# Patient Record
Sex: Male | Born: 1950 | Race: White | Hispanic: No | Marital: Married | Smoking: Never smoker
Health system: Southern US, Community
[De-identification: ages and names within clinical notes are randomized; demographics above are authoritative.]

## PROBLEM LIST (undated history)

## (undated) DIAGNOSIS — E785 Hyperlipidemia, unspecified: Secondary | ICD-10-CM

## (undated) DIAGNOSIS — M109 Gout, unspecified: Secondary | ICD-10-CM

## (undated) DIAGNOSIS — I10 Essential (primary) hypertension: Secondary | ICD-10-CM

## (undated) DIAGNOSIS — F419 Anxiety disorder, unspecified: Secondary | ICD-10-CM

## (undated) DIAGNOSIS — N529 Male erectile dysfunction, unspecified: Secondary | ICD-10-CM

## (undated) HISTORY — DX: Male erectile dysfunction, unspecified: N52.9

## (undated) HISTORY — PX: TONSILLECTOMY AND ADENOIDECTOMY: SUR1326

## (undated) HISTORY — DX: Hyperlipidemia, unspecified: E78.5

## (undated) HISTORY — DX: Essential (primary) hypertension: I10

## (undated) HISTORY — DX: Anxiety disorder, unspecified: F41.9

## (undated) HISTORY — PX: APPENDECTOMY: SHX54

## (undated) HISTORY — DX: Gout, unspecified: M10.9

## (undated) HISTORY — PX: HERNIA REPAIR: SHX51

---

## 2010-09-22 ENCOUNTER — Ambulatory Visit (HOSPITAL_COMMUNITY)
Admission: RE | Admit: 2010-09-22 | Discharge: 2010-09-22 | Disposition: A | Discharge status: Home / Self Care | Payer: 59 | Source: Ambulatory Visit | Attending: Internal Medicine | Admitting: Internal Medicine

## 2010-09-22 ENCOUNTER — Other Ambulatory Visit (HOSPITAL_COMMUNITY): Payer: Self-pay | Admitting: Internal Medicine

## 2010-09-22 DIAGNOSIS — R52 Pain, unspecified: Secondary | ICD-10-CM | POA: Insufficient documentation

## 2010-09-22 DIAGNOSIS — M47817 Spondylosis without myelopathy or radiculopathy, lumbosacral region: Secondary | ICD-10-CM | POA: Insufficient documentation

## 2010-09-22 DIAGNOSIS — M539 Dorsopathy, unspecified: Secondary | ICD-10-CM | POA: Insufficient documentation

## 2012-02-15 IMAGING — CR DG LUMBAR SPINE COMPLETE 4+V
5 series · 5 of 5 positions shown · non-contrast
Comparison: None.

CLINICAL DATA: Back pain.  780.96.

LUMBAR SPINE - COMPLETE 4+ VIEW

[view not recorded (1 of 5)]
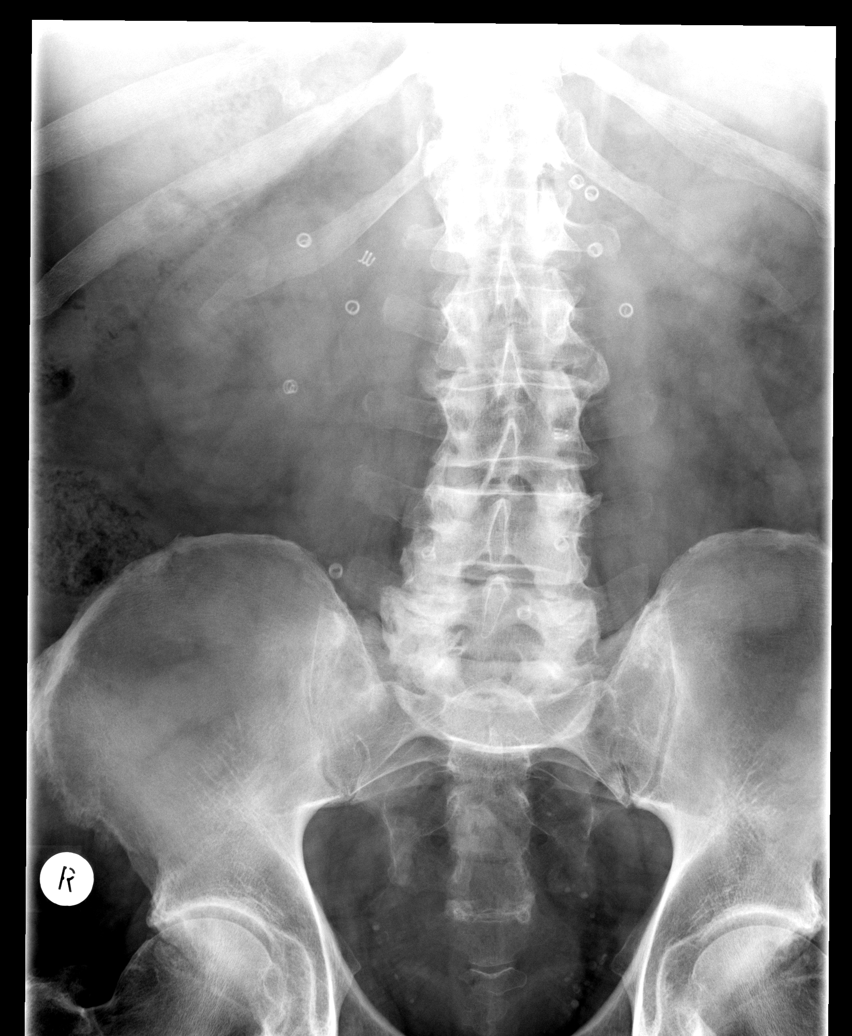

[view not recorded (2 of 5)]
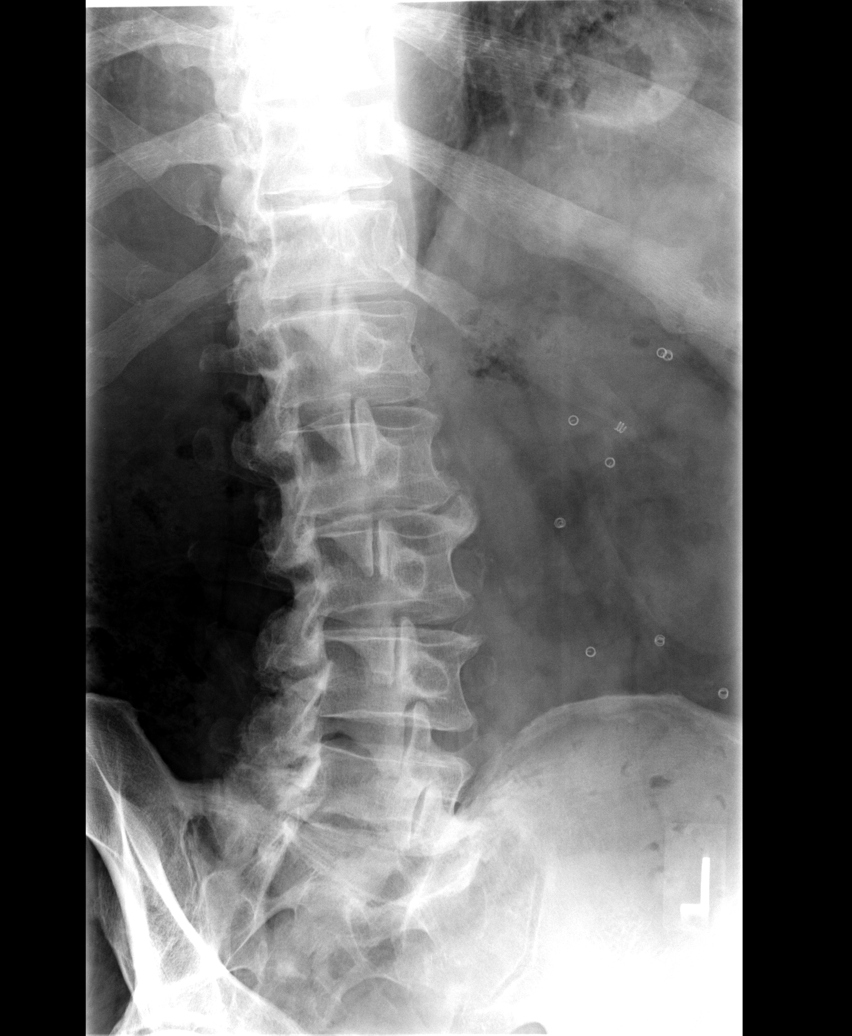

[view not recorded (3 of 5)]
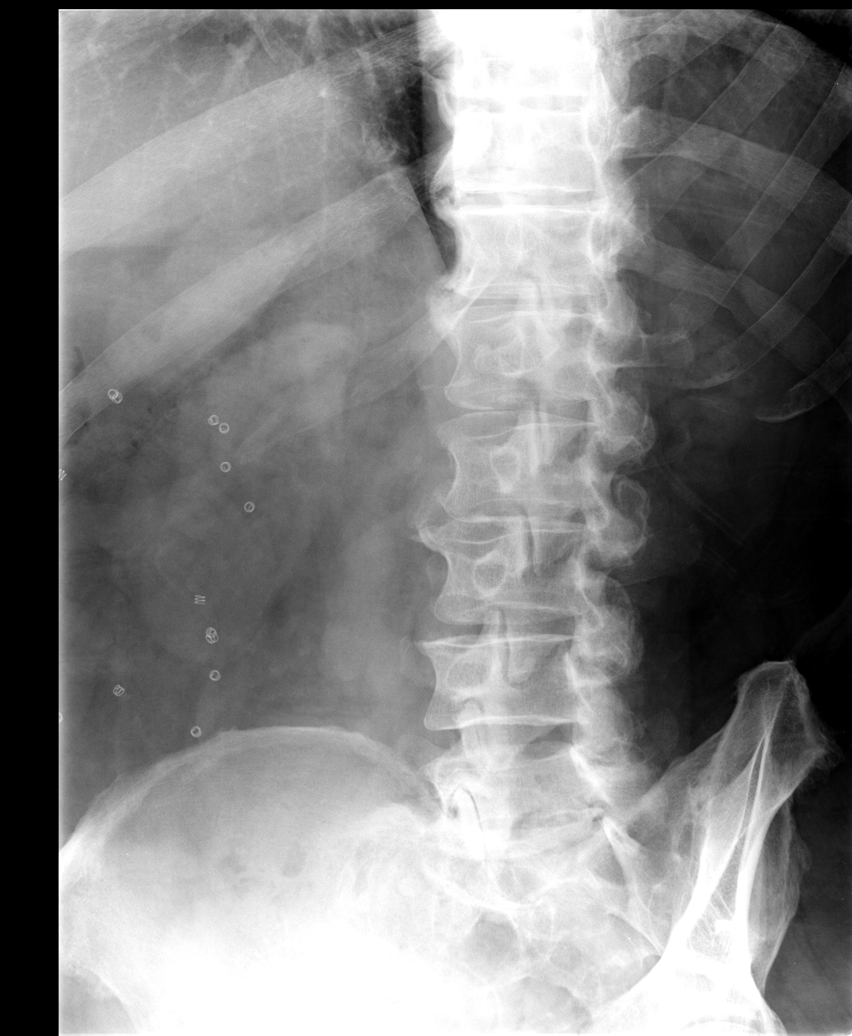

[view not recorded (4 of 5)]
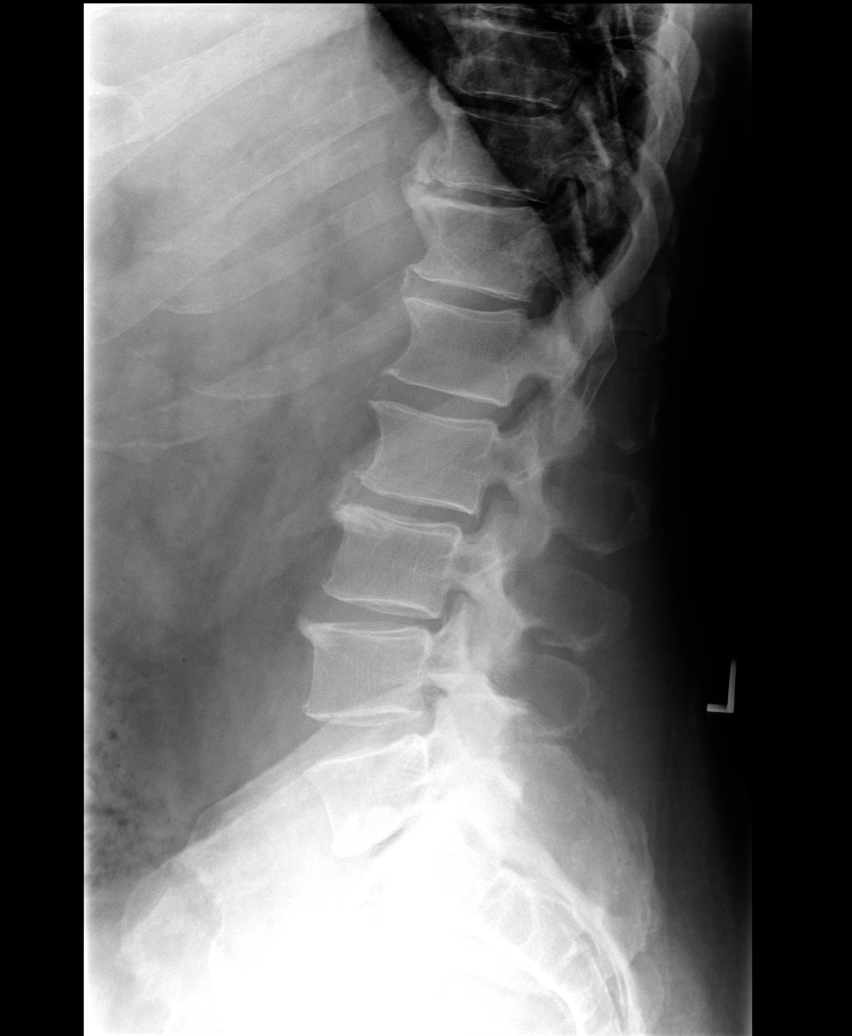

[view not recorded (5 of 5)]
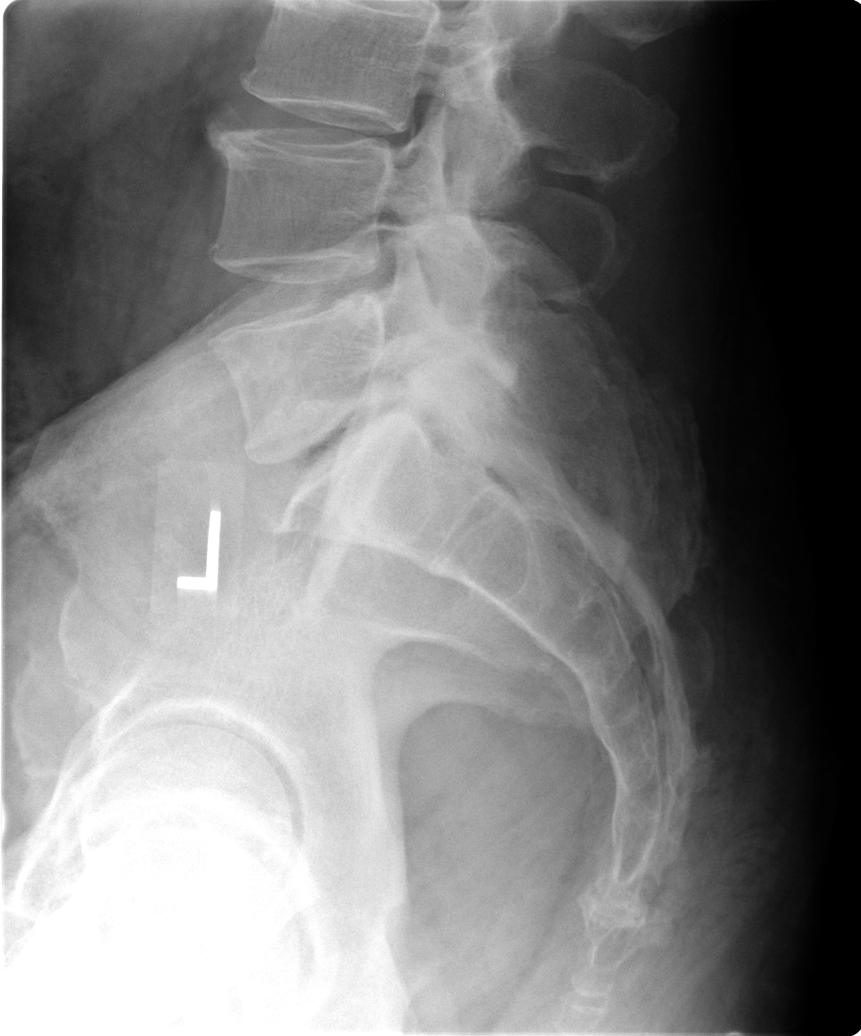

[5 of 5 positions shown; findings below may reference images not displayed]

FINDINGS: There is no fracture.  There is mild disc space narrowing
at L2-3 and moderate disc space narrowing at L5-S1.  There is also
moderate facet arthritis throughout the lumbar spine, most severe
at L4-5 and L5 S1 bilaterally, right greater than left.
IMPRESSION: Degenerative disc and joint disease in the lower lumbar spine.

## 2012-08-02 ENCOUNTER — Other Ambulatory Visit: Payer: Self-pay | Admitting: Family Medicine

## 2012-12-18 ENCOUNTER — Encounter (INDEPENDENT_AMBULATORY_CARE_PROVIDER_SITE_OTHER): Payer: Self-pay

## 2012-12-20 ENCOUNTER — Encounter (INDEPENDENT_AMBULATORY_CARE_PROVIDER_SITE_OTHER): Payer: Self-pay | Admitting: Surgery

## 2012-12-20 ENCOUNTER — Ambulatory Visit (INDEPENDENT_AMBULATORY_CARE_PROVIDER_SITE_OTHER): Payer: Commercial Managed Care - PPO | Admitting: Surgery

## 2012-12-20 VITALS — BP 122/82 | HR 64 | Temp 98.6°F | Resp 16 | Ht 61.0 in | Wt 254.0 lb

## 2012-12-20 DIAGNOSIS — R2231 Localized swelling, mass and lump, right upper limb: Secondary | ICD-10-CM

## 2012-12-20 DIAGNOSIS — R229 Localized swelling, mass and lump, unspecified: Secondary | ICD-10-CM

## 2012-12-20 NOTE — Progress Notes (Signed)
Patient ID: Phillip Lutz, male   DOB: 11-Mar-1951, 62 y.o.   MRN: [id number]  Chief Complaint  Patient presents with  . New Evaluation    eval rt shoulder cyst    HPI Phillip Lutz is a 62 y.o. male.  Patient sent at the request of Quentin Mulling PA-C for mass overlying the patient's right clavicle at the acromioclavicular joint. It is not causing any pain, redness or drainage. It has been present for 10 years. It was noted on physical exam. Denies any shoulder pain. HPI  Past Medical History  Diagnosis Date  . Hypertension   . Hyperlipidemia   . Anxiety   . Gout     Past Surgical History  Procedure Laterality Date  . Hernia repair    . Tonsillectomy and adenoidectomy    . Appendectomy      No family history on file.  Social History History  Substance Use Topics  . Smoking status: Never Smoker   . Smokeless tobacco: Not on file  . Alcohol Use: No    No Known Allergies  Current Outpatient Prescriptions  Medication Sig Dispense Refill  . allopurinol (ZYLOPRIM) 300 MG tablet Take 300 mg by mouth daily.      Marland Kitchen ALPRAZolam (XANAX) 0.5 MG tablet Take 0.5 mg by mouth at bedtime as needed for sleep.      . hydrochlorothiazide (HYDRODIURIL) 25 MG tablet Take 25 mg by mouth daily.      Marland Kitchen olmesartan (BENICAR) 40 MG tablet Take 40 mg by mouth daily.      . Sildenafil Citrate (VIAGRA PO) Take by mouth.       No current facility-administered medications for this visit.    Review of Systems Review of Systems  Constitutional: Negative for fever, chills and unexpected weight change.  HENT: Negative for hearing loss, congestion, sore throat, trouble swallowing and voice change.   Eyes: Negative for visual disturbance.  Respiratory: Negative for cough and wheezing.   Cardiovascular: Negative for chest pain, palpitations and leg swelling.  Gastrointestinal: Negative for nausea, vomiting, abdominal pain, diarrhea, constipation, blood in stool, abdominal distention, anal  bleeding and rectal pain.  Genitourinary: Negative for hematuria and difficulty urinating.  Musculoskeletal: Negative for arthralgias.  Skin: Negative for rash and wound.  Neurological: Negative for seizures, syncope, weakness and headaches.  Hematological: Negative for adenopathy. Does not bruise/bleed easily.  Psychiatric/Behavioral: Negative for confusion.    Blood pressure 122/82, pulse 64, temperature 98.6 F (37 C), temperature source Oral, resp. rate 16, height 5\' 1"  (1.549 m), weight 254 lb (115.214 kg).  Physical Exam Physical Exam  Constitutional: He is oriented to person, place, and time. He appears well-developed and well-nourished.  HENT:  Head: Normocephalic and atraumatic.  Eyes: EOM are normal. Pupils are equal, round, and reactive to light.  Neck: Normal range of motion. Neck supple.  Musculoskeletal: Normal range of motion.  Neurological: He is alert and oriented to person, place, and time.  Skin:     Psychiatric: He has a normal mood and affect. His behavior is normal. Thought content normal.    Data Reviewed Notes from Dr Oneta Rack office  Assessment    Right shoulder mass over her a.c. Joint fixed 3 cm    Plan    CT scan of right shoulder to further evaluate relationship of mass to the acromium, clavicle and underlying structures since it is immobile. Operative planning once this is done. If benign can be observed since patient asymptomatic. If involving joint space, refer to  orthopedics.       Noor Vidales A. 12/20/2012, 2:44 PM

## 2012-12-20 NOTE — Patient Instructions (Signed)
Will schedule for CT scan of right shoulder.

## 2012-12-21 ENCOUNTER — Telehealth (INDEPENDENT_AMBULATORY_CARE_PROVIDER_SITE_OTHER): Payer: Self-pay | Admitting: General Surgery

## 2012-12-21 NOTE — Telephone Encounter (Signed)
Spoke with pt and informed him of his Ct scan at [facility] located at [address] on 12/27/12 at 9:00.  Explained that he cannot have solid foods 4 hours before the test.  He informed me that he is fine with this day but he was personally going to call GI ans see if he could reschedule it for a later time that day.  The number was provided for him.

## 2012-12-26 NOTE — Telephone Encounter (Signed)
Spoke with pt's wife and explained that he has been switched to [facility] radiology for his Ct scan.  It is on 12/27/12 at 8:15. All other instructions will stay the same.

## 2012-12-27 ENCOUNTER — Other Ambulatory Visit: Payer: Commercial Managed Care - PPO

## 2012-12-27 ENCOUNTER — Ambulatory Visit (HOSPITAL_COMMUNITY): Payer: 59

## 2012-12-31 ENCOUNTER — Ambulatory Visit (HOSPITAL_COMMUNITY): Payer: 59

## 2013-01-01 ENCOUNTER — Telehealth (INDEPENDENT_AMBULATORY_CARE_PROVIDER_SITE_OTHER): Payer: Self-pay

## 2013-01-01 ENCOUNTER — Other Ambulatory Visit (INDEPENDENT_AMBULATORY_CARE_PROVIDER_SITE_OTHER): Payer: Self-pay

## 2013-01-01 DIAGNOSIS — R2230 Localized swelling, mass and lump, unspecified upper limb: Secondary | ICD-10-CM

## 2013-01-01 NOTE — Telephone Encounter (Signed)
Message copied by Brennan Bailey on Tue Jan 01, 2013 10:58 AM ------      Message from: Dortha Schwalbe      Created: Tue Jan 01, 2013 10:44 AM      Regarding: RE: CT       without      ----- Message -----         From: Brennan Bailey, CMA         Sent: 01/01/2013   9:54 AM           To: Thomas A. Cornett, MD      Subject: FW: CT                                                               ----- Message -----         From: Rolene Course         Sent: 01/01/2013   9:16 AM           To: Brennan Bailey, CMA      Subject: CT                                                       Rebecka Apley  calling from [facility].  She needs you to verify that pt's CT is either with or without contrast.  If without, she will need you to redo the orders.  Pt going tomorrow for Ct.  She can be reached at [phone]            Thanks!      Meagen       ------

## 2013-01-01 NOTE — Telephone Encounter (Signed)
Called Rebecka Apley and let her know I put a new order in for Ct right shoulder without contrast.

## 2013-01-02 ENCOUNTER — Ambulatory Visit (HOSPITAL_COMMUNITY)
Admission: RE | Admit: 2013-01-02 | Discharge: 2013-01-02 | Disposition: A | Discharge status: Home / Self Care | Payer: 59 | Source: Ambulatory Visit | Attending: Surgery | Admitting: Surgery

## 2013-03-04 ENCOUNTER — Telehealth: Payer: Self-pay | Admitting: *Deleted

## 2013-03-04 MED ORDER — ALPRAZOLAM 0.5 MG PO TABS: 0.5000 mg | ORAL_TABLET | Freq: Every evening | ORAL | Status: DC | PRN

## 2013-03-04 NOTE — Telephone Encounter (Signed)
REFILL =  [facility] 0.5MG  #30      [facility]  [address]  [place] [address] [phone] [facility]  [phone] FAX

## 2013-03-05 ENCOUNTER — Other Ambulatory Visit: Payer: Self-pay | Admitting: Physician Assistant

## 2013-03-05 MED ORDER — ALPRAZOLAM 0.5 MG PO TABS: 0.5000 mg | ORAL_TABLET | Freq: Every day | ORAL | Status: DC

## 2013-03-12 ENCOUNTER — Ambulatory Visit: Payer: 59 | Admitting: Physician Assistant

## 2013-05-19 DIAGNOSIS — E785 Hyperlipidemia, unspecified: Secondary | ICD-10-CM | POA: Insufficient documentation

## 2013-05-19 DIAGNOSIS — I10 Essential (primary) hypertension: Secondary | ICD-10-CM | POA: Insufficient documentation

## 2013-05-19 DIAGNOSIS — M109 Gout, unspecified: Secondary | ICD-10-CM | POA: Insufficient documentation

## 2013-05-19 DIAGNOSIS — N529 Male erectile dysfunction, unspecified: Secondary | ICD-10-CM | POA: Insufficient documentation

## 2013-05-19 DIAGNOSIS — F419 Anxiety disorder, unspecified: Secondary | ICD-10-CM | POA: Insufficient documentation

## 2013-05-20 ENCOUNTER — Ambulatory Visit: Payer: Self-pay | Admitting: Physician Assistant

## 2013-05-29 ENCOUNTER — Other Ambulatory Visit: Payer: Self-pay | Admitting: Physician Assistant

## 2013-05-29 MED ORDER — OLMESARTAN MEDOXOMIL 40 MG PO TABS: 40.0000 mg | ORAL_TABLET | Freq: Every day | ORAL | Status: DC

## 2013-06-05 ENCOUNTER — Ambulatory Visit: Payer: Self-pay | Admitting: Physician Assistant

## 2013-06-06 ENCOUNTER — Other Ambulatory Visit: Payer: Self-pay | Admitting: Physician Assistant

## 2013-06-11 ENCOUNTER — Ambulatory Visit: Payer: Self-pay | Admitting: Physician Assistant

## 2013-06-12 ENCOUNTER — Ambulatory Visit: Payer: Self-pay | Admitting: Internal Medicine

## 2013-06-19 ENCOUNTER — Ambulatory Visit (INDEPENDENT_AMBULATORY_CARE_PROVIDER_SITE_OTHER): Payer: 59 | Admitting: Physician Assistant

## 2013-06-19 ENCOUNTER — Encounter: Payer: Self-pay | Admitting: Physician Assistant

## 2013-06-19 VITALS — BP 152/82 | HR 80 | Temp 97.9°F | Resp 16 | Ht 72.0 in | Wt 261.0 lb

## 2013-06-19 DIAGNOSIS — F419 Anxiety disorder, unspecified: Secondary | ICD-10-CM

## 2013-06-19 DIAGNOSIS — F411 Generalized anxiety disorder: Secondary | ICD-10-CM

## 2013-06-19 DIAGNOSIS — Z79899 Other long term (current) drug therapy: Secondary | ICD-10-CM

## 2013-06-19 DIAGNOSIS — E785 Hyperlipidemia, unspecified: Secondary | ICD-10-CM

## 2013-06-19 DIAGNOSIS — I10 Essential (primary) hypertension: Secondary | ICD-10-CM

## 2013-06-19 DIAGNOSIS — E559 Vitamin D deficiency, unspecified: Secondary | ICD-10-CM

## 2013-06-19 LAB — CBC WITH DIFFERENTIAL/PLATELET
BASOS PCT: 1 % (ref 0–1)
Basophils Absolute: 0.1 10*3/uL (ref 0.0–0.1)
EOS ABS: 0.2 10*3/uL (ref 0.0–0.7)
EOS PCT: 3 % (ref 0–5)
HCT: 44.2 % (ref 39.0–52.0)
Hemoglobin: 15.3 g/dL (ref 13.0–17.0)
LYMPHS ABS: 3 10*3/uL (ref 0.7–4.0)
Lymphocytes Relative: 46 % (ref 12–46)
MCH: 31.2 pg (ref 26.0–34.0)
MCHC: 34.6 g/dL (ref 30.0–36.0)
MCV: 90 fL (ref 78.0–100.0)
Monocytes Absolute: 0.7 10*3/uL (ref 0.1–1.0)
Monocytes Relative: 10 % (ref 3–12)
NEUTROS PCT: 40 % — AB (ref 43–77)
Neutro Abs: 2.6 10*3/uL (ref 1.7–7.7)
PLATELETS: 178 10*3/uL (ref 150–400)
RBC: 4.91 MIL/uL (ref 4.22–5.81)
RDW: 14.4 % (ref 11.5–15.5)
WBC: 6.6 10*3/uL (ref 4.0–10.5)

## 2013-06-19 MED ORDER — ACYCLOVIR 400 MG PO TABS: 400.0000 mg | ORAL_TABLET | Freq: Every day | ORAL | Status: AC

## 2013-06-19 MED ORDER — SILDENAFIL CITRATE 100 MG PO TABS: 100.0000 mg | ORAL_TABLET | ORAL | Status: AC | PRN

## 2013-06-19 NOTE — Progress Notes (Signed)
HPI 63 y.o. male  presents for 3 month follow up with hypertension, hyperlipidemia, prediabetes and vitamin D. His blood pressure has been controlled at home, today their BP is BP: 152/82 mmHg He denies chest pain, shortness of breath, dizziness.  His cholesterol is diet controlled.  His cholesterol is not controlled. The cholesterol last visit was: LDL 155. Declines statins  Patient is on Vitamin D supplement. , B12 was 303 Has been taking his wives acyclovir 400mg  QD and he has not had any outbreaks.  History of calcium 11.2.  Current Medications:  Current Outpatient Prescriptions on File Prior to Visit  Medication Sig Dispense Refill  . allopurinol (ZYLOPRIM) 300 MG tablet Take 300 mg by mouth daily.      Marland Kitchen. ALPRAZolam (XANAX) 0.5 MG tablet Take 1 tablet (0.5 mg total) by mouth at bedtime.  30 tablet  4  . meloxicam (MOBIC) 15 MG tablet TAKE 1 TABLET BY MOUTH ONCE DAILY  30 tablet  0  . olmesartan (BENICAR) 40 MG tablet Take 1 tablet (40 mg total) by mouth daily.  30 tablet  3  . Sildenafil Citrate (VIAGRA PO) Take by mouth.       No current facility-administered medications on file prior to visit.   Medical History:  Past Medical History  Diagnosis Date  . Gout   . Anxiety   . Hyperlipidemia   . Hypertension   . ED (erectile dysfunction)   . Gout    Allergies: No Known Allergies   Review of Systems: [X]  = complains of  [ ]  = denies  General: Fatigue [ ]  Fever [ ]  Chills [ ]  Weakness [ ]   Insomnia [ ]  Eyes: Redness [ ]  Blurred vision [ ]  Diplopia [ ]   ENT: Congestion [ ]  Sinus Pain [ ]  Post Nasal Drip [ ]  Sore Throat [ ]  Earache [ ]   Cardiac: Chest pain/pressure [ ]  SOB [ ]  Orthopnea [ ]   Palpitations [ ]   Paroxysmal nocturnal dyspnea[ ]  Claudication [ ]  Edema [ ]   Pulmonary: Cough [ ]  Wheezing[ ]   SOB [ ]   Snoring [ ]   GI: Nausea [ ]  Vomiting[ ]  Dysphagia[ ]  Heartburn[ ]  Abdominal pain [ ]  Constipation [ ] ; Diarrhea [ ] ; BRBPR [ ]  Melena[ ]  GU: Hematuria[ ]  Dysuria [ ]   Nocturia[ ]  Urgency [ ]   Hesitancy [ ]  Discharge [ ]  Neuro: Headaches[ ]  Vertigo[ ]  Paresthesias[ ]  Spasm [ ]  Speech changes [ ]  Incoordination [ ]   Ortho: Arthritis [ ]  Joint pain [ ]  Muscle pain [ ]  Joint swelling [ ]  Back Pain [ ]  Skin:  Rash [ ]   Pruritis [ ]  Change in skin lesion [ ]   Psych: Depression[ ]  Anxiety[ ]  Confusion [ ]  Memory loss [ ]   Heme/Lypmh: Bleeding [ ]  Bruising [ ]  Enlarged lymph nodes [ ]   Endocrine: Visual blurring [ ]  Paresthesia [ ]  Polyuria [ ]  Polydypsea [ ]    Heat/cold intolerance [ ]  Hypoglycemia [ ]   Family history- Review and unchanged Social history- Review and unchanged Physical Exam: Filed Vitals:   06/19/13 1511  BP: 152/82  Pulse: 80  Temp: 97.9 F (36.6 C)  Resp: 16   Wt Readings from Last 3 Encounters:  06/19/13 261 lb (118.389 kg)  12/20/12 254 lb (115.214 kg)   General Appearance: Well nourished, in no apparent distress. Eyes: PERRLA, EOMs, conjunctiva no swelling or erythema Sinuses: No Frontal/maxillary tenderness ENT/Mouth: Ext aud canals clear, TMs without erythema, bulging. No erythema, swelling, or exudate  on post pharynx.  Tonsils not swollen or erythematous. Hearing normal.  Neck: Supple, thyroid normal.  Respiratory: Respiratory effort normal, BS equal bilaterally without rales, rhonchi, wheezing or stridor.  Cardio: RRR with no MRGs. Brisk peripheral pulses without edema.  Abdomen: Soft, + BS.  Non tender, no guarding, rebound, hernias, masses. Lymphatics: Non tender without lymphadenopathy.  Musculoskeletal: Full ROM, 5/5 strength, normal gait.  Skin: Warm, dry without rashes, lesions, ecchymosis.  Neuro: Cranial nerves intact. Normal muscle tone, no cerebellar symptoms. Sensation intact.  Psych: Awake and oriented X 3, normal affect, Insight and Judgment appropriate.   Assessment and Plan:  Hypertension: Continue medication, monitor blood pressure at home.  Continue DASH diet. Cholesterol: Continue diet and exercise.  Check cholesterol.  Pre-diabetes-Continue diet and exercise. Check A1C Vitamin D Def- check level and continue medications.  B12 def Hypercalcemia  Continue diet and meds as discussed. Further disposition pending results of labs.  Quentin Mulling 3:47 PM

## 2013-06-19 NOTE — Patient Instructions (Addendum)
1-2 TBSP of BENEFIBER or the similar medication daily with the red yeast rice for cholesterol  DASH Diet The DASH diet stands for "Dietary Approaches to Stop Hypertension." It is a healthy eating plan that has been shown to reduce high blood pressure (hypertension) in as little as 14 days, while also possibly providing other significant health benefits. These other health benefits include reducing the risk of breast cancer after menopause and reducing the risk of type 2 diabetes, heart disease, colon cancer, and stroke. Health benefits also include weight loss and slowing kidney failure in patients with chronic kidney disease.  DIET GUIDELINES  Limit salt (sodium). Your diet should contain less than 1500 mg of sodium daily.  Limit refined or processed carbohydrates. Your diet should include mostly whole grains. Desserts and added sugars should be used sparingly.  Include small amounts of heart-healthy fats. These types of fats include nuts, oils, and tub margarine. Limit saturated and trans fats. These fats have been shown to be harmful in the body. CHOOSING FOODS  The following food groups are based on a 2000 calorie diet. See your Registered Dietitian for individual calorie needs. Grains and Grain Products (6 to 8 servings daily)  Eat More Often: Whole-wheat bread, brown rice, whole-grain or wheat pasta, quinoa, popcorn without added fat or salt (air popped).  Eat Less Often: White bread, white pasta, white rice, cornbread. Vegetables (4 to 5 servings daily)  Eat More Often: Fresh, frozen, and canned vegetables. Vegetables may be raw, steamed, roasted, or grilled with a minimal amount of fat.  Eat Less Often/Avoid: Creamed or fried vegetables. Vegetables in a cheese sauce. Fruit (4 to 5 servings daily)  Eat More Often: All fresh, canned (in natural juice), or frozen fruits. Dried fruits without added sugar. One hundred percent fruit juice ( cup [237 mL] daily).  Eat Less Often: Dried  fruits with added sugar. Canned fruit in light or heavy syrup. [employer], Fish, and Poultry (2 servings or less daily. One serving is 3 to 4 oz [85-114 g]).  Eat More Often: Ninety percent or leaner ground beef, tenderloin, sirloin. Round cuts of beef, chicken breast, [place] breast. All fish. Grill, bake, or broil your meat. Nothing should be fried.  Eat Less Often/Avoid: Fatty cuts of meat, [place], or chicken leg, thigh, or wing. Fried cuts of meat or fish. Dairy (2 to 3 servings)  Eat More Often: Low-fat or fat-free milk, low-fat plain or light yogurt, reduced-fat or part-skim cheese.  Eat Less Often/Avoid: Milk (whole, 2%).Whole milk yogurt. Full-fat cheeses. Nuts, Seeds, and Legumes (4 to 5 servings per week)  Eat More Often: All without added salt.  Eat Less Often/Avoid: Salted nuts and seeds, canned beans with added salt. Fats and Sweets (limited)  Eat More Often: Vegetable oils, tub margarines without trans fats, sugar-free gelatin. Mayonnaise and salad dressings.  Eat Less Often/Avoid: Coconut oils, palm oils, butter, stick margarine, cream, half and half, cookies, candy, pie. FOR MORE INFORMATION The Dash Diet Eating Plan: www.dashdiet.org Document Released: 03/31/2011 Document Revised: 07/04/2011 Document Reviewed: 03/31/2011 [facility] Patient Information 2014 [place], [place]. Cholesterol Cholesterol is a white, waxy, fat-like protein needed by your body in small amounts. The liver makes all the cholesterol you need. It is carried from the liver by the blood through the blood vessels. Deposits (plaque) may build up on blood vessel walls. This makes the arteries narrower and stiffer. Plaque increases the risk for heart attack and stroke. You cannot feel your cholesterol level even if it is  very high. The only way to know is by a blood test to check your lipid (fats) levels. Once you know your cholesterol levels, you should keep a record of the test results. Work with your  caregiver to to keep your levels in the desired range. WHAT THE RESULTS MEAN:  Total cholesterol is a rough measure of all the cholesterol in your blood.  LDL is the so-called bad cholesterol. This is the type that deposits cholesterol in the walls of the arteries. You want this level to be low.  HDL is the good cholesterol because it cleans the arteries and carries the LDL away. You want this level to be high.  Triglycerides are fat that the body can either burn for energy or store. High levels are closely linked to heart disease. DESIRED LEVELS:  Total cholesterol below 200.  LDL below 100 for people at risk, below 70 for very high risk.  HDL above 50 is good, above 60 is best.  Triglycerides below 150. HOW TO LOWER YOUR CHOLESTEROL:  Diet.  Choose fish or white meat chicken and [place]key, roasted or baked. Limit fatty cuts of red meat, fried foods, and processed meats, such as sausage and lunch meat.  Eat lots of fresh fruits and vegetables. Choose whole grains, beans, pasta, potatoes and cereals.  Use only small amounts of olive, corn or canola oils. Avoid butter, mayonnaise, shortening or palm kernel oils. Avoid foods with trans-fats.  Use skim/nonfat milk and low-fat/nonfat yogurt and cheeses. Avoid whole milk, cream, ice cream, egg yolks and cheeses. Healthy desserts include angel food cake, ginger snaps, animal crackers, hard candy, popsicles, and low-fat/nonfat frozen yogurt. Avoid pastries, cakes, pies and cookies.  Exercise.  A regular program helps decrease LDL and raises HDL.  Helps with weight control.  Do things that increase your activity level like gardening, walking, or taking the stairs.  Medication.  May be prescribed by your caregiver to help lowering cholesterol and the risk for heart disease.  You may need medicine even if your levels are normal if you have several risk factors. HOME CARE INSTRUCTIONS   Follow your diet and exercise programs as  suggested by your caregiver.  Take medications as directed.  Have blood work done when your caregiver feels it is necessary. MAKE SURE YOU:   Understand these instructions.  Will watch your condition.  Will get help right away if you are not doing well or get worse. Document Released: 01/04/2001 Document Revised: 07/04/2011 Document Reviewed: 01/23/2013 [facility] Patient Information 2014 [place]

## 2013-06-20 LAB — LIPID PANEL
CHOLESTEROL: 217 mg/dL — AB (ref 0–200)
HDL: 60 mg/dL (ref >39)
LDL Cholesterol: 132 mg/dL — ABNORMAL HIGH (ref 0–99)
Total CHOL/HDL Ratio: 3.6 Ratio
Triglycerides: 127 mg/dL (ref <150)
VLDL: 25 mg/dL (ref 0–40)

## 2013-06-20 LAB — MAGNESIUM: MAGNESIUM: 1.6 mg/dL (ref 1.5–2.5)

## 2013-06-20 LAB — BASIC METABOLIC PANEL WITH GFR
BUN: 15 mg/dL (ref 6–23)
CALCIUM: 11.5 mg/dL — AB (ref 8.4–10.5)
CO2: 29 meq/L (ref 19–32)
CREATININE: 0.93 mg/dL (ref 0.50–1.35)
Chloride: 101 mEq/L (ref 96–112)
GFR, Est Non African American: 88 mL/min
GLUCOSE: 65 mg/dL — AB (ref 70–99)
Potassium: 4.3 mEq/L (ref 3.5–5.3)
SODIUM: 140 meq/L (ref 135–145)

## 2013-06-20 LAB — HEPATIC FUNCTION PANEL
ALBUMIN: 4.6 g/dL (ref 3.5–5.2)
ALT: 57 U/L — AB (ref 0–53)
AST: 38 U/L — AB (ref 0–37)
Alkaline Phosphatase: 57 U/L (ref 39–117)
BILIRUBIN DIRECT: 0.1 mg/dL (ref 0.0–0.3)
BILIRUBIN TOTAL: 0.5 mg/dL (ref 0.2–1.2)
Indirect Bilirubin: 0.4 mg/dL (ref 0.2–1.2)
Total Protein: 6.9 g/dL (ref 6.0–8.3)

## 2013-06-20 LAB — VITAMIN D 25 HYDROXY (VIT D DEFICIENCY, FRACTURES): VIT D 25 HYDROXY: 56 ng/mL (ref 30–89)

## 2013-06-20 LAB — TSH: TSH: 2.563 u[IU]/mL (ref 0.350–4.500)

## 2013-08-20 ENCOUNTER — Other Ambulatory Visit: Payer: Self-pay | Admitting: Physician Assistant

## 2013-08-22 ENCOUNTER — Other Ambulatory Visit: Payer: Self-pay | Admitting: Physician Assistant

## 2013-08-22 MED ORDER — ALPRAZOLAM 0.5 MG PO TABS: 0.5000 mg | ORAL_TABLET | Freq: Every day | ORAL | Status: DC

## 2013-10-16 ENCOUNTER — Other Ambulatory Visit: Payer: Self-pay | Admitting: Physician Assistant

## 2013-10-16 NOTE — Telephone Encounter (Signed)
Patient cancelled  Multiple ov, needs ov before any additional refills authorized.

## 2013-11-20 ENCOUNTER — Other Ambulatory Visit: Payer: Self-pay | Admitting: Physician Assistant

## 2014-01-13 ENCOUNTER — Encounter: Payer: Self-pay | Admitting: Physician Assistant

## 2014-02-21 ENCOUNTER — Other Ambulatory Visit: Payer: Self-pay | Admitting: Physician Assistant

## 2014-02-21 NOTE — Telephone Encounter (Signed)
Can you call the pharmacy about this one? Last refill of xanax in Epic says 02/10/2014.  

## 2014-02-24 NOTE — Telephone Encounter (Signed)
Can you call the pharmacy about this one? Last refill of xanax in Epic says 02/10/2014.

## 2014-03-10 ENCOUNTER — Other Ambulatory Visit: Payer: Self-pay

## 2014-03-10 MED ORDER — ALLOPURINOL 300 MG PO TABS: ORAL_TABLET | ORAL | Status: DC

## 2014-03-18 ENCOUNTER — Encounter: Payer: Self-pay | Admitting: Physician Assistant

## 2014-03-19 ENCOUNTER — Encounter: Payer: Self-pay | Admitting: Physician Assistant

## 2014-03-25 ENCOUNTER — Encounter: Payer: Self-pay | Admitting: Physician Assistant

## 2014-03-25 ENCOUNTER — Ambulatory Visit (INDEPENDENT_AMBULATORY_CARE_PROVIDER_SITE_OTHER): Payer: Managed Care, Other (non HMO) | Admitting: Physician Assistant

## 2014-03-25 VITALS — BP 132/78 | HR 64 | Temp 98.1°F | Resp 16 | Ht 72.0 in | Wt 270.0 lb

## 2014-03-25 DIAGNOSIS — I10 Essential (primary) hypertension: Secondary | ICD-10-CM

## 2014-03-25 DIAGNOSIS — M5442 Lumbago with sciatica, left side: Secondary | ICD-10-CM

## 2014-03-25 DIAGNOSIS — E785 Hyperlipidemia, unspecified: Secondary | ICD-10-CM

## 2014-03-25 DIAGNOSIS — R6889 Other general symptoms and signs: Secondary | ICD-10-CM

## 2014-03-25 DIAGNOSIS — Z1159 Encounter for screening for other viral diseases: Secondary | ICD-10-CM

## 2014-03-25 DIAGNOSIS — E559 Vitamin D deficiency, unspecified: Secondary | ICD-10-CM

## 2014-03-25 DIAGNOSIS — R7989 Other specified abnormal findings of blood chemistry: Secondary | ICD-10-CM

## 2014-03-25 DIAGNOSIS — Z1211 Encounter for screening for malignant neoplasm of colon: Secondary | ICD-10-CM

## 2014-03-25 DIAGNOSIS — M109 Gout, unspecified: Secondary | ICD-10-CM

## 2014-03-25 DIAGNOSIS — F419 Anxiety disorder, unspecified: Secondary | ICD-10-CM

## 2014-03-25 DIAGNOSIS — M5441 Lumbago with sciatica, right side: Secondary | ICD-10-CM

## 2014-03-25 DIAGNOSIS — Z125 Encounter for screening for malignant neoplasm of prostate: Secondary | ICD-10-CM

## 2014-03-25 DIAGNOSIS — E669 Obesity, unspecified: Secondary | ICD-10-CM

## 2014-03-25 DIAGNOSIS — N529 Male erectile dysfunction, unspecified: Secondary | ICD-10-CM

## 2014-03-25 DIAGNOSIS — Z0001 Encounter for general adult medical examination with abnormal findings: Secondary | ICD-10-CM

## 2014-03-25 DIAGNOSIS — Z79899 Other long term (current) drug therapy: Secondary | ICD-10-CM

## 2014-03-25 DIAGNOSIS — R945 Abnormal results of liver function studies: Secondary | ICD-10-CM

## 2014-03-25 MED ORDER — RANITIDINE HCL 150 MG PO TABS: 150.0000 mg | ORAL_TABLET | Freq: Two times a day (BID) | ORAL | Status: DC

## 2014-03-25 NOTE — Patient Instructions (Signed)
Take the nexium/protonix for 2-4 more weeks once a day, then switch to pepcid or zantac (generic is fine) 2 x a day for 2 weeks, then once a day for 2 weeks and then stop. Avoid alcohol, spicy foods, NSAIDS (aleve, ibuprofen) at this time. See foods below.   Food Choices for Gastroesophageal Reflux Disease When you have gastroesophageal reflux disease (GERD), the foods you eat and your eating habits are very important. Choosing the right foods can help ease the discomfort of GERD. WHAT GENERAL GUIDELINES DO I NEED TO FOLLOW? 1. Choose fruits, vegetables, whole grains, low-fat dairy products, and low-fat meat, fish, and poultry. 2. Limit fats such as oils, salad dressings, butter, nuts, and avocado. 3. Keep a food diary to identify foods that cause symptoms. 4. Avoid foods that cause reflux. These may be different for different people. 5. Eat frequent small meals instead of three large meals each day. 6. Eat your meals slowly, in a relaxed setting. 7. Limit fried foods. 8. Cook foods using methods other than frying. 9. Avoid drinking alcohol. 10. Avoid drinking large amounts of liquids with your meals. 11. Avoid bending over or lying down until 2-3 hours after eating. WHAT FOODS ARE NOT RECOMMENDED? The following are some foods and drinks that may worsen your symptoms: Vegetables Tomatoes. Tomato juice. Tomato and spaghetti sauce. Chili peppers. Onion and garlic. Horseradish. Fruits Oranges, grapefruit, and lemon (fruit and juice). Meats High-fat meats, fish, and poultry. This includes hot dogs, ribs, ham, sausage, salami, and bacon. Dairy Whole milk and chocolate milk. Sour cream. Cream. Butter. Ice cream. Cream cheese.  Beverages Coffee and tea, with or without caffeine. Carbonated beverages or energy drinks. Condiments Hot sauce. Barbecue sauce.  Sweets/Desserts Chocolate and cocoa. Donuts. Peppermint and spearmint. Fats and Oils High-fat foods, including [place]French fries and potato  chips. Other Vinegar. Strong spices, such as black pepper, white pepper, red pepper, cayenne, curry powder, cloves, ginger, and chili powder.     Bad carbs also include fruit juice, alcohol, and sweet tea. These are empty calories that do not signal to your brain that you are full.   Please remember the good carbs are still carbs which convert into sugar. So please measure them out no more than 1/2-1 cup of rice, oatmeal, pasta, and beans  Veggies are however free foods! Pile them on.   Not all fruit is created equal. Please see the list below, the fruit at the bottom is higher in sugars than the fruit at the top. Please avoid all dried fruits.     Get your colonoscopy

## 2014-03-25 NOTE — Progress Notes (Addendum)
Complete Physical  Assessment and Plan: 1. Essential hypertension - continue medications, DASH diet, exercise and monitor at home. Call if greater than 130/80.  - EKG 12-Lead - US, RETROPERITNL ABD,  LTD  2. Erectile dysfunction, unspecified erectile dysfunction type Continue PRN medications  3. Anxiety continue medications, stress management techniques discussed, increase water, good sleep hygiene discussed, increase exercise, and increase veggies.   4. Gout without tophus, unspecified cause, unspecified chronicity, unspecified site  recheck Uric acid as needed, Diet discussed, continue medications. - Uric acid  5. Hyperlipidemia -continue medications, check lipids, decrease fatty foods, increase activity.   6. Obese Obesity with co morbidities- long discussion about weight loss, diet, and exercise - Testosterone  7. Encounter for general adult medical examination with abnormal findings - CBC with Differential - BASIC METABOLIC PANEL WITH GFR - Hepatic function panel - Lipid panel - TSH - Hemoglobin A1c - Insulin, fasting - Vit D  25 hydroxy (rtn osteoporosis monitoring) - Urinalysis, Routine w reflex microscopic - Microalbumin / creatinine urine ratio - Vitamin B12 - Magnesium - PSA - Iron and TIBC - Ferritin - Uric acid - Testosterone  8. Screening for viral disease History of elevated LFTs and per guidelines - Hepatitis A antibody, total - Hepatitis B core antibody, total - Hepatitis B e antibody - Hepatitis B surface antibody - Hepatitis C antibody  9. Hypercalcemia Needs to stop the tums, sent in protonix for him to take for 1 month and than will start on zantac Refer GI for GERD/colonoscopy - Sedimentation rate - Anti-DNA antibody, double-stranded - ANA - Parathyroid Hormone, Intact w/Ca  10. Elevated liver function tests Check labs previously mentioned, avoid tylenol, alcohol, weight loss advised.   11. Bilateral low back pain with sciatica,  sciatica laterality unspecified History of DDD, likely needs OT and injection - Ambulatory referral to Orthopedic Surgery  12. Screen for colon cancer Long discussion about need - Ambulatory referral to Gastroenterology   Discussed med's effects and SE's. Screening labs and tests as requested with regular follow-up as recommended.  Addendum: PTH mildly elevated, Ca better, will recheck 1 month and patient will stop tums, get CXR.  LFTs elevated, will get AB US Will recheck urine for microalbumin.  Ferritin elevated with normal iron, will get CXR, colonoscopy  HPI Patient presents for a complete physical.    His blood pressure has been controlled at home, he is on benicar, today their BP is BP: 132/78 mmHg He does workout. He denies chest pain, shortness of breath, dizziness.  He is not on cholesterol medication and denies myalgias. His cholesterol is at goal. The cholesterol last visit was:   Lab Results  Component Value Date   CHOL 217* 06/19/2013   HDL 60 06/19/2013   LDLCALC 132* 06/19/2013   TRIG 127 06/19/2013   CHOLHDL 3.6 06/19/2013    Last A1C in the office was: 5.3  Patient is on Vitamin D supplement.  B12 was 303 last visit.  Lab Results  Component Value Date   VD25OH 56 06/19/2013     Patient is on allopurinol for gout and does not report a recent flare.  He has a history of hypercalcemia, last 11.5.  Last AST/ALT 38/57, but states that he eats tums often.  He is not on vitamin D at this time.  He has history of OA, has bilateral hip/buttocks pain with aching down bilateral legs. Walking helps. History of lower back pain with Xray in 2012 showing moderate-severe DDD.  BMI is Body  mass index is 36.61 kg/(m^2)., he is working on diet and exercise.  Wt Readings from Last 3 Encounters:  03/25/14 270 lb (122.471 kg)  06/19/13 261 lb (118.389 kg)  12/20/12 254 lb (115.214 kg)    Current Medications:  Current Outpatient Prescriptions on File Prior to Visit   Medication Sig Dispense Refill  . allopurinol (ZYLOPRIM) 300 MG tablet TAKE 1 TABLET BY MOUTH ONCE DAILY 90 tablet 0  . ALPRAZolam (XANAX) 0.5 MG tablet Take 1 tablet (0.5 mg total) by mouth at bedtime. 30 tablet 4  . BENICAR 40 MG tablet TAKE 1 TABLET BY MOUTH ONCE DAILY 30 tablet 3  . meloxicam (MOBIC) 15 MG tablet TAKE 1 TABLET BY MOUTH ONCE DAILY. NEED APPOINTMENT FOR MORE REFILLS. 30 tablet 4  . sildenafil (VIAGRA) 100 MG tablet Take 1 tablet (100 mg total) by mouth as needed for erectile dysfunction. 6 tablet 3   No current facility-administered medications on file prior to visit.   Health Maintenance:  Tetanus: declines Pneumovax: declines Prevnar 13: declines Flu vaccine: declines Zostavax: declines DEXA: Colonoscopy:Has not had willing to get- would prefer [place] EGD:  Allergies: No Known Allergies Medical History:  Past Medical History  Diagnosis Date  . Gout   . Anxiety   . Hyperlipidemia   . Hypertension   . ED (erectile dysfunction)   . Gout    Surgical History:  Past Surgical History  Procedure Laterality Date  . Hernia repair    . Tonsillectomy and adenoidectomy    . Appendectomy     Family History:  Family History  Problem Relation Age of Onset  . Heart attack Father   . Hypertension Father    Social History:   History  Substance Use Topics  . Smoking status: Never Smoker   . Smokeless tobacco: Not on file  . Alcohol Use: No  Review of Systems: see HPI  Physical Exam: Estimated body mass index is 36.61 kg/(m^2) as calculated from the following:   Height as of this encounter: 6' (1.829 m).   Weight as of this encounter: 270 lb (122.471 kg). BP 132/78 mmHg  Pulse 64  Temp(Src) 98.1 F (36.7 C)  Resp 16  Ht 6' (1.829 m)  Wt 270 lb (122.471 kg)  BMI 36.61 kg/m2 General Appearance: Well nourished, in no apparent distress.  Eyes: PERRLA, EOMs, conjunctiva no swelling or erythema, normal fundi and vessels.  Sinuses: No  Frontal/maxillary tenderness  ENT/Mouth: Ext aud canals clear, normal light reflex with TMs without erythema, bulging. Good dentition. No erythema, swelling, or exudate on post pharynx. Tonsils not swollen or erythematous. Hearing normal.  Neck: Supple, thyroid normal. No bruits  Respiratory: Respiratory effort normal, BS equal bilaterally without rales, rhonchi, wheezing or stridor.  Cardio: RRR without murmurs, rubs or gallops. Brisk peripheral pulses without edema.  Chest: symmetric, with normal excursions and percussion.  Abdomen: Soft, nontender, obese, no guarding, rebound, hernias, masses, or organomegaly. .  Lymphatics: Non tender without lymphadenopathy.  Genitourinary: defer Musculoskeletal: Full ROM all peripheral extremities,5/5 strength, and normal gait. Negative straight leg raise, + mid line tenderness lower back.  Skin: Warm, dry without rashes, lesions, ecchymosis. Neuro: Cranial nerves intact, reflexes equal bilaterally. Normal muscle tone, no cerebellar symptoms. Sensation intact.  Psych: Awake and oriented X 3, normal affect, Insight and Judgment appropriate.   EKG: IRBBB, PRWP, no other ST changes AORTA SCAN: WNL  Quentin Mullingollier, Christabelle Hanzlik 10:41 AM [facility]

## 2014-03-26 LAB — CBC WITH DIFFERENTIAL/PLATELET
Basophils Absolute: 0.1 10*3/uL (ref 0.0–0.1)
Basophils Relative: 1 % (ref 0–1)
Eosinophils Absolute: 0.3 10*3/uL (ref 0.0–0.7)
Eosinophils Relative: 4 % (ref 0–5)
HEMATOCRIT: 44.8 % (ref 39.0–52.0)
HEMOGLOBIN: 15.4 g/dL (ref 13.0–17.0)
LYMPHS ABS: 3 10*3/uL (ref 0.7–4.0)
LYMPHS PCT: 47 % — AB (ref 12–46)
MCH: 31.6 pg (ref 26.0–34.0)
MCHC: 34.4 g/dL (ref 30.0–36.0)
MCV: 91.8 fL (ref 78.0–100.0)
MONOS PCT: 8 % (ref 3–12)
MPV: 10.5 fL (ref 9.4–12.4)
Monocytes Absolute: 0.5 10*3/uL (ref 0.1–1.0)
NEUTROS PCT: 40 % — AB (ref 43–77)
Neutro Abs: 2.5 10*3/uL (ref 1.7–7.7)
Platelets: 170 10*3/uL (ref 150–400)
RBC: 4.88 MIL/uL (ref 4.22–5.81)
RDW: 14.2 % (ref 11.5–15.5)
WBC: 6.3 10*3/uL (ref 4.0–10.5)

## 2014-03-26 LAB — MICROALBUMIN / CREATININE URINE RATIO
Creatinine, Urine: 106.7 mg/dL
Microalb Creat Ratio: 76.9 mg/g — ABNORMAL HIGH (ref 0.0–30.0)
Microalb, Ur: 8.2 mg/dL — ABNORMAL HIGH (ref <2.0)

## 2014-03-26 LAB — IRON AND TIBC
%SAT: 24 % (ref 20–55)
IRON: 79 ug/dL (ref 42–165)
TIBC: 336 ug/dL (ref 215–435)
UIBC: 257 ug/dL (ref 125–400)

## 2014-03-26 LAB — URINALYSIS, ROUTINE W REFLEX MICROSCOPIC
BILIRUBIN URINE: NEGATIVE
GLUCOSE, UA: NEGATIVE mg/dL
HGB URINE DIPSTICK: NEGATIVE
Ketones, ur: NEGATIVE mg/dL
LEUKOCYTES UA: NEGATIVE
Nitrite: NEGATIVE
PH: 5.5 (ref 5.0–8.0)
Protein, ur: NEGATIVE mg/dL
Specific Gravity, Urine: 1.011 (ref 1.005–1.030)
Urobilinogen, UA: 0.2 mg/dL (ref 0.0–1.0)

## 2014-03-26 LAB — VITAMIN D 25 HYDROXY (VIT D DEFICIENCY, FRACTURES): Vit D, 25-Hydroxy: 39 ng/mL (ref 30–100)

## 2014-03-26 LAB — HEPATITIS A ANTIBODY, TOTAL: Hep A Total Ab: NONREACTIVE

## 2014-03-26 LAB — HEPATIC FUNCTION PANEL
ALK PHOS: 59 U/L (ref 39–117)
ALT: 65 U/L — ABNORMAL HIGH (ref 0–53)
AST: 38 U/L — AB (ref 0–37)
Albumin: 4.3 g/dL (ref 3.5–5.2)
BILIRUBIN DIRECT: 0.1 mg/dL (ref 0.0–0.3)
Indirect Bilirubin: 0.3 mg/dL (ref 0.2–1.2)
Total Bilirubin: 0.4 mg/dL (ref 0.2–1.2)
Total Protein: 6.8 g/dL (ref 6.0–8.3)

## 2014-03-26 LAB — BASIC METABOLIC PANEL WITH GFR
BUN: 13 mg/dL (ref 6–23)
CHLORIDE: 104 meq/L (ref 96–112)
CO2: 25 meq/L (ref 19–32)
Calcium: 10.5 mg/dL (ref 8.4–10.5)
Creat: 0.88 mg/dL (ref 0.50–1.35)
GFR, Est African American: >89 mL/min
GFR, Est Non African American: >89 mL/min
GLUCOSE: 94 mg/dL (ref 70–99)
Potassium: 4.4 mEq/L (ref 3.5–5.3)
SODIUM: 141 meq/L (ref 135–145)

## 2014-03-26 LAB — HEMOGLOBIN A1C
HEMOGLOBIN A1C: 5.2 % (ref <5.7)
Mean Plasma Glucose: 103 mg/dL (ref <117)

## 2014-03-26 LAB — FERRITIN: Ferritin: 525 ng/mL — ABNORMAL HIGH (ref 22–322)

## 2014-03-26 LAB — LIPID PANEL
Cholesterol: 218 mg/dL — ABNORMAL HIGH (ref 0–200)
HDL: 58 mg/dL (ref >39)
LDL Cholesterol: 136 mg/dL — ABNORMAL HIGH (ref 0–99)
Total CHOL/HDL Ratio: 3.8 Ratio
Triglycerides: 120 mg/dL (ref <150)
VLDL: 24 mg/dL (ref 0–40)

## 2014-03-26 LAB — HEPATITIS B CORE ANTIBODY, TOTAL: Hep B Core Total Ab: NONREACTIVE

## 2014-03-26 LAB — URIC ACID: Uric Acid, Serum: 4.7 mg/dL (ref 4.0–7.8)

## 2014-03-26 LAB — TESTOSTERONE: Testosterone: 546 ng/dL (ref 300–890)

## 2014-03-26 LAB — PTH, INTACT AND CALCIUM
Calcium: 10.8 mg/dL — ABNORMAL HIGH (ref 8.4–10.5)
PTH: 78 pg/mL — ABNORMAL HIGH (ref 14–64)

## 2014-03-26 LAB — MAGNESIUM: Magnesium: 1.7 mg/dL (ref 1.5–2.5)

## 2014-03-26 LAB — HEPATITIS B SURFACE ANTIBODY,QUALITATIVE: Hep B S Ab: NEGATIVE

## 2014-03-26 LAB — TSH: TSH: 1.81 u[IU]/mL (ref 0.350–4.500)

## 2014-03-26 LAB — SEDIMENTATION RATE: SED RATE: 4 mm/h (ref 0–16)

## 2014-03-26 LAB — VITAMIN B12: Vitamin B-12: 347 pg/mL (ref 211–911)

## 2014-03-26 LAB — PSA: PSA: 2.67 ng/mL (ref <=4.00)

## 2014-03-26 LAB — INSULIN, FASTING: Insulin fasting, serum: 9.9 u[IU]/mL (ref 2.0–19.6)

## 2014-03-26 LAB — HEPATITIS C ANTIBODY: HCV AB: NEGATIVE

## 2014-03-26 LAB — ANA: ANA: NEGATIVE

## 2014-03-26 LAB — ANTI-DNA ANTIBODY, DOUBLE-STRANDED: ds DNA Ab: <1 IU/mL

## 2014-03-27 NOTE — Addendum Note (Signed)
Addended by: Quentin MullingOLLIER, AMANDA R on: 03/27/2014 08:46 AM   Modules accepted: Orders, SmartSet

## 2014-03-28 LAB — HEPATITIS B E ANTIBODY: Hepatitis Be Antibody: NONREACTIVE

## 2014-04-05 ENCOUNTER — Other Ambulatory Visit: Payer: Self-pay | Admitting: Physician Assistant

## 2014-04-10 ENCOUNTER — Other Ambulatory Visit: Payer: Self-pay | Admitting: Physician Assistant

## 2014-04-10 ENCOUNTER — Other Ambulatory Visit: Payer: Self-pay

## 2014-04-10 MED ORDER — OLMESARTAN MEDOXOMIL 40 MG PO TABS: ORAL_TABLET | ORAL | Status: DC

## 2014-04-14 ENCOUNTER — Other Ambulatory Visit: Payer: Self-pay | Admitting: Physician Assistant

## 2014-05-15 ENCOUNTER — Ambulatory Visit: Payer: Self-pay | Admitting: Physician Assistant

## 2014-06-02 ENCOUNTER — Other Ambulatory Visit: Payer: Self-pay | Admitting: Physician Assistant

## 2014-06-06 ENCOUNTER — Other Ambulatory Visit: Payer: Self-pay | Admitting: Physician Assistant

## 2014-06-06 DIAGNOSIS — G4700 Insomnia, unspecified: Secondary | ICD-10-CM

## 2014-07-23 ENCOUNTER — Other Ambulatory Visit: Payer: Self-pay | Admitting: Internal Medicine

## 2014-07-23 ENCOUNTER — Other Ambulatory Visit: Payer: Self-pay | Admitting: Physician Assistant

## 2014-07-30 ENCOUNTER — Other Ambulatory Visit: Payer: Self-pay

## 2014-07-30 MED ORDER — OLMESARTAN MEDOXOMIL 40 MG PO TABS: 40.0000 mg | ORAL_TABLET | Freq: Every day | ORAL | Status: DC

## 2014-07-30 NOTE — Telephone Encounter (Signed)
Patient and his wife are aware that Benicar is not covered and will have to pay out of pocket per insurance company

## 2014-08-07 ENCOUNTER — Other Ambulatory Visit: Payer: Self-pay | Admitting: Physician Assistant

## 2014-09-13 ENCOUNTER — Other Ambulatory Visit: Payer: Self-pay | Admitting: Physician Assistant

## 2014-10-10 ENCOUNTER — Ambulatory Visit: Payer: Self-pay | Admitting: Internal Medicine

## 2014-11-05 ENCOUNTER — Other Ambulatory Visit: Payer: Self-pay | Admitting: Physician Assistant

## 2015-01-20 ENCOUNTER — Other Ambulatory Visit: Payer: Self-pay | Admitting: Internal Medicine

## 2015-03-17 ENCOUNTER — Other Ambulatory Visit: Payer: Self-pay | Admitting: Physician Assistant

## 2015-03-17 MED ORDER — ALPRAZOLAM 0.5 MG PO TABS: 0.5000 mg | ORAL_TABLET | Freq: Every day | ORAL | Status: AC

## 2015-03-17 MED ORDER — ALLOPURINOL 300 MG PO TABS: 300.0000 mg | ORAL_TABLET | Freq: Every day | ORAL | Status: AC

## 2015-03-17 MED ORDER — ACYCLOVIR 400 MG PO TABS: ORAL_TABLET | ORAL | Status: AC

## 2015-03-17 MED ORDER — MELOXICAM 15 MG PO TABS: 15.0000 mg | ORAL_TABLET | Freq: Every day | ORAL | Status: AC

## 2015-03-17 MED ORDER — RANITIDINE HCL 150 MG PO TABS: 150.0000 mg | ORAL_TABLET | Freq: Two times a day (BID) | ORAL | Status: AC

## 2015-03-17 MED ORDER — OLMESARTAN MEDOXOMIL 40 MG PO TABS: ORAL_TABLET | ORAL | Status: DC

## 2015-03-17 NOTE — Progress Notes (Signed)
Spoke with pt about setting up an appt to be seen and pt states he will call back to schedule an appt. Pt states that he has already picked up his meds.

## 2015-03-26 ENCOUNTER — Encounter: Payer: Self-pay | Admitting: Physician Assistant

## 2015-05-11 ENCOUNTER — Other Ambulatory Visit: Payer: Self-pay | Admitting: Physician Assistant

## 2015-06-17 ENCOUNTER — Other Ambulatory Visit: Payer: Self-pay

## 2015-06-17 MED ORDER — OLMESARTAN MEDOXOMIL 40 MG PO TABS: ORAL_TABLET | ORAL | Status: AC

## 2015-06-24 ENCOUNTER — Ambulatory Visit: Payer: Self-pay | Admitting: Physician Assistant

## 2015-06-24 ENCOUNTER — Other Ambulatory Visit: Payer: Self-pay

## 2015-07-13 ENCOUNTER — Other Ambulatory Visit: Payer: Self-pay | Admitting: Physician Assistant

## 2015-07-23 ENCOUNTER — Other Ambulatory Visit: Payer: Self-pay | Admitting: Physician Assistant

## 2016-07-04 DIAGNOSIS — I10 Essential (primary) hypertension: Secondary | ICD-10-CM
